# Patient Record
Sex: Male | Born: 1990 | Race: White | Hispanic: No | Marital: Single
Health system: Southern US, Community
[De-identification: ages and names within clinical notes are randomized; demographics above are authoritative.]

---
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
## 2011-01-20 ENCOUNTER — Emergency Department: Payer: Self-pay | Admitting: Emergency Medicine

## 2011-01-26 ENCOUNTER — Emergency Department: Payer: Self-pay | Admitting: Internal Medicine

## 2011-01-26 ENCOUNTER — Emergency Department: Payer: Self-pay

## 2011-06-11 ENCOUNTER — Emergency Department: Payer: Self-pay | Admitting: Internal Medicine

## 2011-06-15 ENCOUNTER — Emergency Department: Payer: Self-pay | Admitting: Emergency Medicine

## 2011-06-16 ENCOUNTER — Emergency Department (HOSPITAL_COMMUNITY)
Admission: EM | Admit: 2011-06-16 | Discharge: 2011-06-16 | Disposition: A | Discharge status: Home / Self Care | Payer: Worker's Compensation | Attending: Emergency Medicine | Admitting: Emergency Medicine

## 2011-06-16 DIAGNOSIS — Z79899 Other long term (current) drug therapy: Secondary | ICD-10-CM | POA: Insufficient documentation

## 2011-06-16 DIAGNOSIS — Z87891 Personal history of nicotine dependence: Secondary | ICD-10-CM | POA: Insufficient documentation

## 2011-06-16 DIAGNOSIS — M549 Dorsalgia, unspecified: Secondary | ICD-10-CM | POA: Insufficient documentation

## 2011-11-25 ENCOUNTER — Emergency Department: Payer: Self-pay | Admitting: Internal Medicine

## 2013-02-03 ENCOUNTER — Emergency Department: Payer: Self-pay | Admitting: Emergency Medicine

## 2013-02-03 LAB — URINALYSIS, COMPLETE
Bacteria: NONE SEEN
Bilirubin,UR: NEGATIVE
Glucose,UR: NEGATIVE mg/dL (ref 0–75)
Ketone: NEGATIVE
Leukocyte Esterase: NEGATIVE
Nitrite: NEGATIVE
Protein: 30
WBC UR: 1 /HPF (ref 0–5)

## 2013-02-03 LAB — COMPREHENSIVE METABOLIC PANEL
Albumin: 4.6 g/dL (ref 3.4–5.0)
Alkaline Phosphatase: 83 U/L (ref 50–136)
Anion Gap: 4 — ABNORMAL LOW (ref 7–16)
BUN: 20 mg/dL — ABNORMAL HIGH (ref 7–18)
Bilirubin,Total: 0.6 mg/dL (ref 0.2–1.0)
Chloride: 105 mmol/L (ref 98–107)
Creatinine: 0.92 mg/dL (ref 0.60–1.30)
EGFR (Non-African Amer.): >60
Glucose: 75 mg/dL (ref 65–99)
Osmolality: 277 (ref 275–301)
SGOT(AST): 22 U/L (ref 15–37)
SGPT (ALT): 24 U/L (ref 12–78)
Sodium: 138 mmol/L (ref 136–145)
Total Protein: 8.6 g/dL — ABNORMAL HIGH (ref 6.4–8.2)

## 2013-02-03 LAB — DRUG SCREEN, URINE
Barbiturates, Ur Screen: NEGATIVE (ref ?–200)
Benzodiazepine, Ur Scrn: NEGATIVE (ref ?–200)
Cannabinoid 50 Ng, Ur ~~LOC~~: POSITIVE (ref ?–50)
Cocaine Metabolite,Ur ~~LOC~~: NEGATIVE (ref ?–300)
Methadone, Ur Screen: NEGATIVE (ref ?–300)
Opiate, Ur Screen: NEGATIVE (ref ?–300)
Phencyclidine (PCP) Ur S: NEGATIVE (ref ?–25)

## 2013-02-03 LAB — CBC
HCT: 44.3 % (ref 40.0–52.0)
MCH: 29.8 pg (ref 26.0–34.0)
MCHC: 34.4 g/dL (ref 32.0–36.0)
Platelet: 230 10*3/uL (ref 150–440)
RBC: 5.13 10*6/uL (ref 4.40–5.90)
RDW: 12.9 % (ref 11.5–14.5)

## 2013-06-24 ENCOUNTER — Emergency Department: Payer: Self-pay | Admitting: Internal Medicine

## 2015-03-26 IMAGING — CR RIGHT HAND - COMPLETE 3+ VIEW
1 series · 3 of 3 positions shown · non-contrast
Comparison: none

REASON FOR EXAM: r hand pain
COMMENTS:

[Series 1: pa · 0.17mm/px · 3 of 3 slices shown]
[im 1/3]
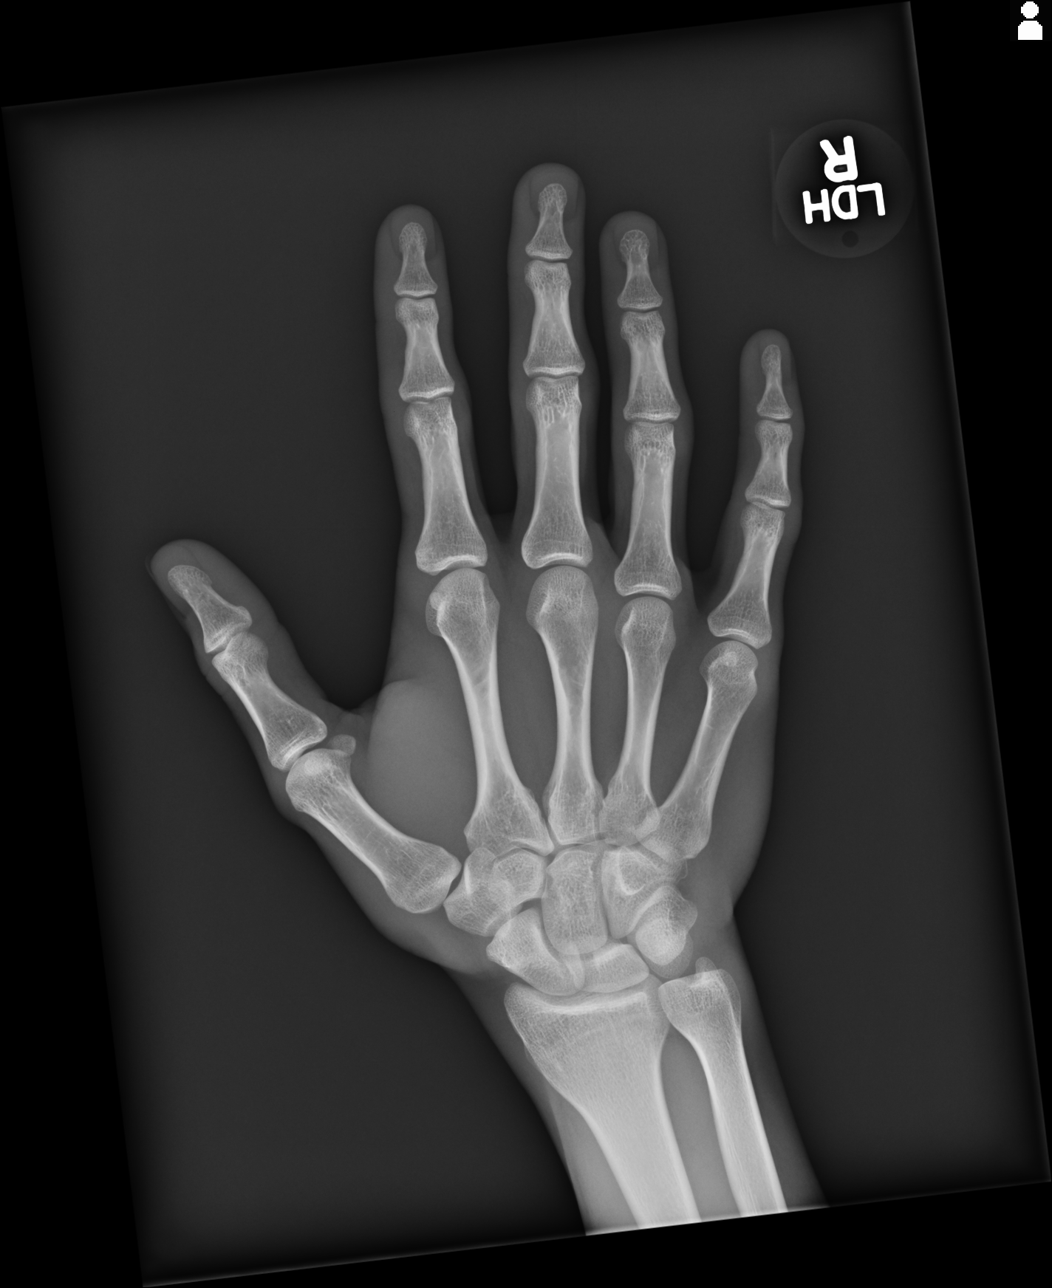
[im 2/3]
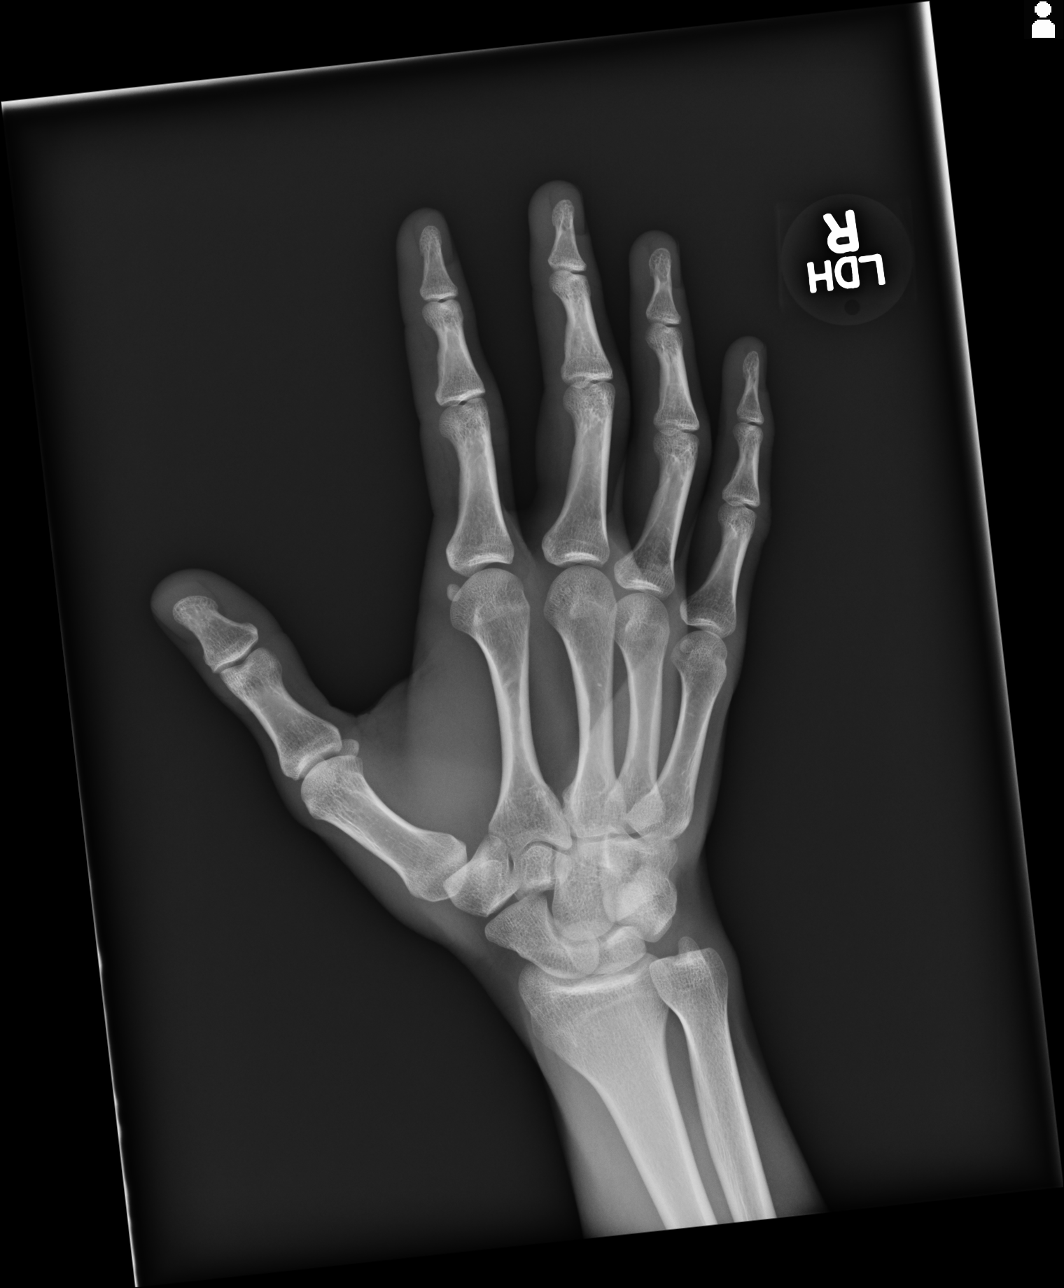
[im 3/3]
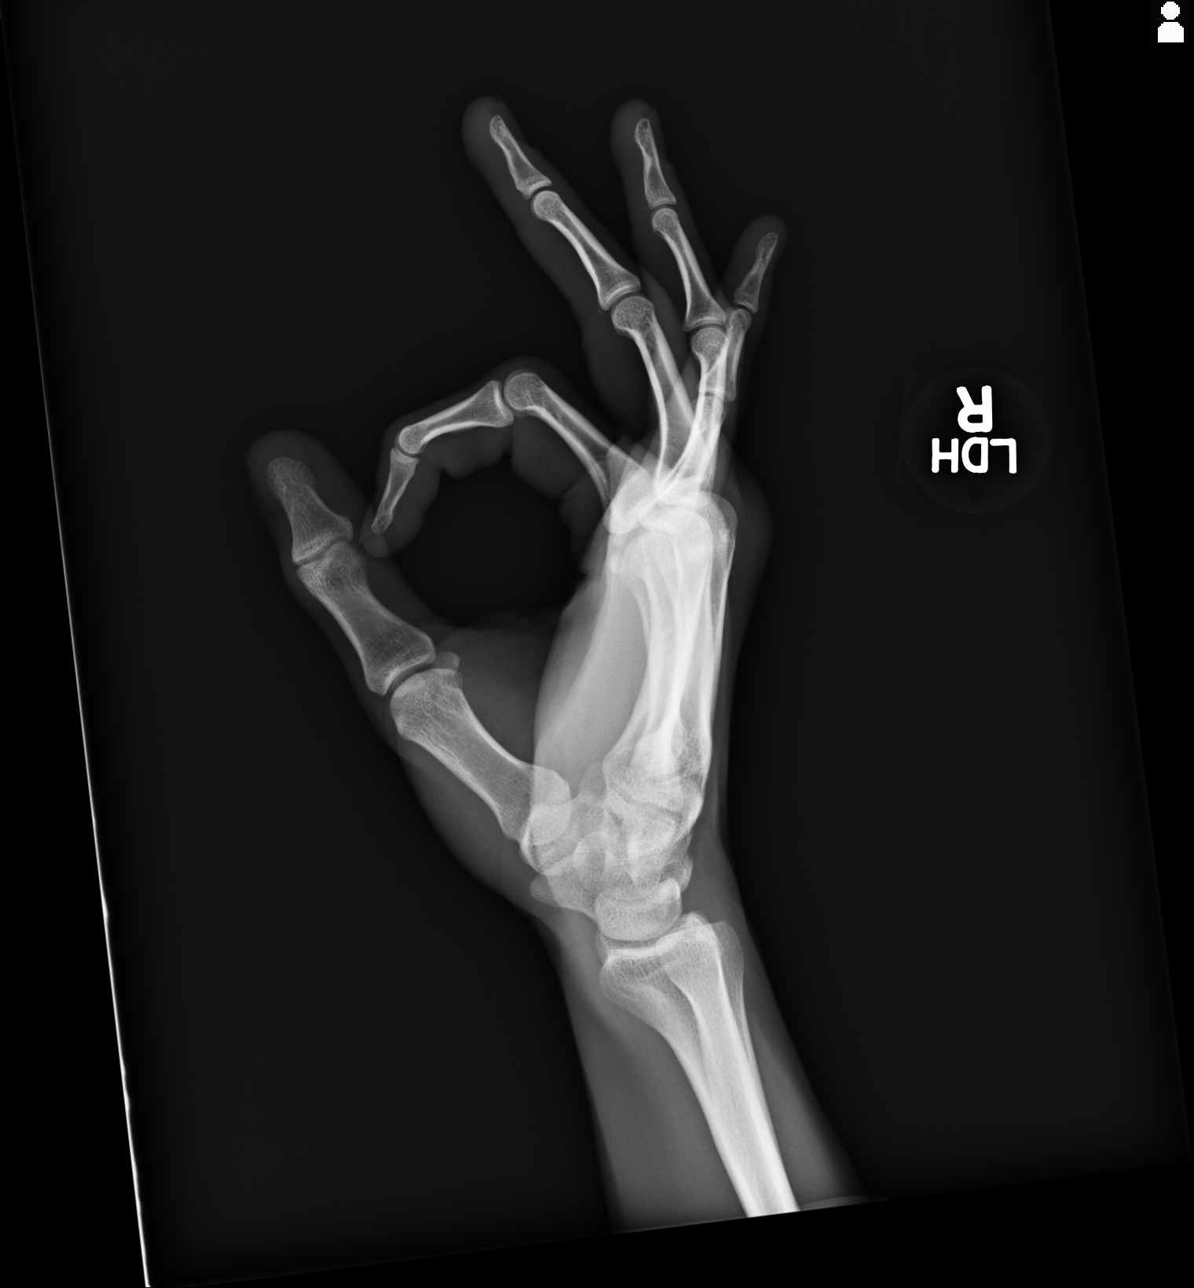

[3 of 3 positions shown; findings below may reference images not displayed]

PROCEDURE:     DXR - DXR HAND RT COMPLETE W/OBLIQUES  - June 24, 2013  [DATE]

RESULT:     Three views of the right hand reveal the bones to be adequately
mineralized. Specific attention to the metacarpals reveals no acute
fracture. The phalanges appear intact. The interphalangeal joints and the
metacarpophalangeal joints appear normal. The bones of the wrist are normal
where visualized.
IMPRESSION: There is no acute bony abnormality of the right hand.

[REDACTED]

## 2020-05-12 ENCOUNTER — Ambulatory Visit: Payer: Self-pay | Attending: Internal Medicine

## 2020-05-12 DIAGNOSIS — Z23 Encounter for immunization: Secondary | ICD-10-CM

## 2020-05-12 NOTE — Progress Notes (Signed)
   Covid-19 Vaccination Clinic  Name:  WAEL MAESTAS    MRN: [id number] DOB: 1991/08/22  05/12/2020  Mr. Orourke was observed post Covid-19 immunization for 15 minutes without incident. He was provided with Vaccine Information Sheet and instruction to access the V-Safe system.   Mr. Vallance was instructed to call 911 with any severe reactions post vaccine: Marland Kitchen Difficulty breathing  . Swelling of face and throat  . A fast heartbeat  . A bad rash all over body  . Dizziness and weakness   Immunizations Administered    Name Date Dose VIS Date Route   Pfizer COVID-19 Vaccine 05/12/2020  3:23 PM 0.3 mL 11/21/2018 Intramuscular   Manufacturer: [employer], [employer]   Lot: [id number]   NDC: [address]-7

## 2020-06-09 ENCOUNTER — Ambulatory Visit: Payer: Self-pay

## 2024-10-07 ENCOUNTER — Emergency Department
Admission: EM | Admit: 2024-10-07 | Discharge: 2024-10-07 | Disposition: A | Discharge status: Home / Self Care | Payer: Self-pay | Attending: Emergency Medicine | Admitting: Emergency Medicine

## 2024-10-07 DIAGNOSIS — F22 Delusional disorders: Secondary | ICD-10-CM | POA: Insufficient documentation

## 2024-10-07 DIAGNOSIS — Z5321 Procedure and treatment not carried out due to patient leaving prior to being seen by health care provider: Secondary | ICD-10-CM | POA: Insufficient documentation

## 2024-10-07 NOTE — ED Triage Notes (Signed)
 First Nurse Note:  Pt via [facility] from home. Pt was recently incarnated. Pt had a sudden onset of panic, paranoia, EMS reports pt kept saying please don't, please don't. Initially, pt was very hesitant for EMS to do anything, but eventually let EMS do his VS and assessment.   EMS reports;  Initial HR 154, most recent 110  99% on RA 32 ETCO2  153/95 BP 22 RR

## 2024-10-07 NOTE — ED Notes (Signed)
 This RN visualized pt walking out of the lobby, this RN asked the pt if he was leaving, pt states, yeah I am good now and left the facility.

## 2024-10-08 ENCOUNTER — Encounter: Payer: Self-pay | Admitting: *Deleted

## 2024-10-08 ENCOUNTER — Other Ambulatory Visit: Payer: Self-pay

## 2024-10-08 ENCOUNTER — Emergency Department
Admission: EM | Admit: 2024-10-08 | Discharge: 2024-10-08 | Disposition: A | Discharge status: Home / Self Care | Payer: Self-pay | Attending: Emergency Medicine | Admitting: Emergency Medicine

## 2024-10-08 DIAGNOSIS — F1910 Other psychoactive substance abuse, uncomplicated: Secondary | ICD-10-CM | POA: Insufficient documentation

## 2024-10-08 NOTE — ED Triage Notes (Addendum)
 Pt ambulatory to triage.  Pt brought in by a stranger.  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   Last used yesterday.   Denies etoh use.  Pt denies SI or HI.  Pt alert  speech clear.

## 2024-10-08 NOTE — ED Provider Notes (Signed)
 "  [facility] Provider Note    Event Date/Time   First MD Initiated Contact with Patient 10/08/24 2027     (approximate)   History   Drug Problem    HPI  Chase Mccann is a 34 y.o. male   with no significant past medical history who presents to the ED complaining of drug problem. According to the patient's mother, patient stopped consuming meth yesterday.  He did not consume any other substances today.  Patient denies nauseas, vomiting, abdominal pain, chest pain, diarrhea.  Mother endorses he took 2 glasses of water and soda today.  Patient desires to be referred for detox.  Patient denies SI, HI.  Patient endorses he does not want to die.     There are no active problems to display for this patient.    Physical Exam   Triage Vital Signs: ED Triage Vitals  Encounter Vitals Group     BP 10/08/24 1818 135/77     Girls Systolic BP Percentile --      Girls Diastolic BP Percentile --      Boys Systolic BP Percentile --      Boys Diastolic BP Percentile --      Pulse Rate 10/08/24 1818 100     Resp 10/08/24 1818 18     Temp 10/08/24 1818 98.3 F (36.8 C)     Temp Source 10/08/24 1818 Oral     SpO2 10/08/24 1818 99 %     Weight 10/08/24 1815 160 lb (72.6 kg)     Height 10/08/24 1815 5' 8 (1.727 m)     Head Circumference --      Peak Flow --      Pain Score 10/08/24 1815 0     Pain Loc --      Pain Education --      Exclude from Growth Chart --     Most recent vital signs: Vitals:   10/08/24 1818  BP: 135/77  Pulse: 100  Resp: 18  Temp: 98.3 F (36.8 C)  SpO2: 99%     Physical Exam Vitals and nursing note reviewed.  Triage vital signs were normal.  General:          Awake, no distress.  No signs of difficulty breathing, patient is calm and sleepy. Eyes: Subconjunctival erythema, PERRLA. CV:                  Good peripheral perfusion. Regular rate and rhythm. Resp:               Normal effort. no tachypnea.Equal breath sounds  bilaterally.  No wheezing, no rales no crackles Abd:                 No distention.  Soft nontender no signs of peritoneal irritation. Other:               ED Results / Procedures / Treatments   Labs (all labs ordered are listed, but only abnormal results are displayed) Labs Reviewed - No data to display  PROCEDURES:  Critical Care performed:   Procedures   MEDICATIONS ORDERED IN ED: Medications - No data to display    IMPRESSION / MDM / ASSESSMENT AND PLAN / ED COURSE  I reviewed the triage vital signs and the nursing notes.  Differential diagnosis includes, but is not limited to, use dependence, intoxication, dehydration  Patient's presentation is most consistent with acute, uncomplicated illness.   Chase Mccann is  a 34 y.o., male who was brought today by his mother, patient desires to get into detox place.  See HPI for further information.  On a physical exam vital signs were normal.  Patient was calm, sleeping.  The pulmonary is clear, abdomen is soft nontender no signs of urinary retention. Patient's diagnosis is consistent with substance abuse.  I did not order any imaging or labs, physical exam is reassuring. I did review the patient's allergies and medications.The patient is in stable and satisfactory condition for discharge home  Patient will be discharged home without prescriptions.. Patient is to follow up with [facility] as needed or otherwise directed. Patient is given ED precautions to return to the ED for any worsening or new symptoms. Discussed plan of care with patient, answered all of patient's questions, patient agreeable to plan of care. Patient verbalized understanding.  FINAL CLINICAL IMPRESSION(S) / ED DIAGNOSES   Final diagnoses:  Substance abuse (HCC)     Rx / DC Orders   ED Discharge Orders     None        Note:  This document was prepared using Dragon voice recognition software and may include unintentional dictation errors.   Janit Kast, PA-C 10/08/24 2050    Waymond Lorelle Cummins, MD 10/08/24 2056  "

## 2024-10-08 NOTE — Discharge Instructions (Addendum)
 Your son Is diagnosed with substance abuse.  Please call or go to the [facility] for detox.  Please give him plenty of fluids.  Please come back to ED or go to your PCP if the patient has new symptoms or symptoms worsen.
